# Patient Record
Sex: Male | Born: 1995 | Hispanic: Yes | Smoking: Never smoker
Health system: Southern US, Community
[De-identification: ages and names within clinical notes are randomized; demographics above are authoritative.]

## PROBLEM LIST (undated history)

## (undated) HISTORY — PX: EYE SURGERY: SHX253

## (undated) HISTORY — PX: TESTICLE SURGERY: SHX794

---

## 2018-08-19 ENCOUNTER — Encounter (HOSPITAL_COMMUNITY): Payer: Self-pay

## 2018-08-19 ENCOUNTER — Other Ambulatory Visit: Payer: Self-pay

## 2018-08-19 ENCOUNTER — Emergency Department (HOSPITAL_COMMUNITY)
Admission: EM | Admit: 2018-08-19 | Discharge: 2018-08-19 | Disposition: A | Discharge status: Home / Self Care | Payer: Self-pay | Attending: Emergency Medicine | Admitting: Emergency Medicine

## 2018-08-19 DIAGNOSIS — R21 Rash and other nonspecific skin eruption: Secondary | ICD-10-CM | POA: Insufficient documentation

## 2018-08-19 MED ORDER — DOXYCYCLINE HYCLATE 100 MG PO CAPS: 100.0000 mg | ORAL_CAPSULE | Freq: Two times a day (BID) | ORAL | 0 refills | Status: AC

## 2018-08-19 NOTE — ED Provider Notes (Signed)
[facility]-EMERGENCY DEPT Provider Note   CSN: [id number] Arrival date & time: 08/19/18  1215    History   Chief Complaint Chief Complaint  Patient presents with  . Rash    HPI Gae DryYohanly Yortez is a 23 y.o. male.     23 year old male presents with rash to his groin which is now spread to his genitals x2 to 3 weeks.  Characterizes it as pruritic without associated penile drainage or discharge.  No involvement of the palms of his hands or soles of his feet.  States he is sexually active.  Has medicated with topical steroids with minimal relief.  No reported fever or chills.  No prior history of same.  Denies any use of new chemicals     History reviewed. No pertinent past medical history.  There are no active problems to display for this patient.   Past Surgical History:  Procedure Laterality Date  . TESTICLE SURGERY          Home Medications    Prior to Admission medications   Not on File    Family History History reviewed. No pertinent family history.  Social History Social History   Tobacco Use  . Smoking status: Never Smoker  . Smokeless tobacco: Never Used  Substance Use Topics  . Alcohol use: Yes    Comment: occ  . Drug use: Not Currently    Types: Marijuana     Allergies   Patient has no known allergies.   Review of Systems Review of Systems  All other systems reviewed and are negative.    Physical Exam Updated Vital Signs BP (!) 154/97 (BP Location: Right Arm)   Pulse 88   Temp 98.4 F (36.9 C) (Oral)   Resp 14   Ht 1.702 m (5\' 7" )   Wt 79.8 kg   SpO2 98%   BMI 27.57 kg/m   Physical Exam Vitals signs and nursing note reviewed.  Constitutional:      General: He is not in acute distress.    Appearance: Normal appearance. He is well-developed. He is not toxic-appearing.  HENT:     Head: Normocephalic and atraumatic.  Eyes:     General: Lids are normal.     Conjunctiva/sclera: Conjunctivae normal.   Pupils: Pupils are equal, round, and reactive to light.  Neck:     Musculoskeletal: Normal range of motion and neck supple.     Thyroid: No thyroid mass.     Trachea: No tracheal deviation.  Cardiovascular:     Rate and Rhythm: Normal rate and regular rhythm.     Heart sounds: Normal heart sounds. No murmur. No gallop.   Pulmonary:     Effort: Pulmonary effort is normal. No respiratory distress.     Breath sounds: Normal breath sounds. No stridor. No decreased breath sounds, wheezing, rhonchi or rales.  Abdominal:     General: Bowel sounds are normal. There is no distension.     Palpations: Abdomen is soft.     Tenderness: There is no abdominal tenderness. There is no rebound.  Genitourinary:    Pubic Area: Rash present.     Penis: Uncircumcised. No erythema or discharge.   Musculoskeletal: Normal range of motion.        General: No tenderness.  Skin:    General: Skin is warm and dry.     Findings: No abrasion or rash.       Neurological:     Mental Status: He is alert and oriented  to person, place, and time.     GCS: GCS eye subscore is 4. GCS verbal subscore is 5. GCS motor subscore is 6.     Cranial Nerves: No cranial nerve deficit.     Sensory: No sensory deficit.  Psychiatric:        Speech: Speech normal.        Behavior: Behavior normal.      ED Treatments / Results  Labs (all labs ordered are listed, but only abnormal results are displayed) Labs Reviewed - No data to display  EKG None  Radiology No results found.  Procedures Procedures (including critical care time)  Medications Ordered in ED Medications - No data to display   Initial Impression / Assessment and Plan / ED Course  I have reviewed the triage vital signs and the nursing notes.  Pertinent labs & imaging results that were available during my care of the patient were reviewed by me and considered in my medical decision making (see chart for details).        Patient with probable  folliculitis.  Possibility of syphilis as well given his rash but he has no palmar involvement.  He has no involvement on his torso.  Denied any lesions before this.  Will place patient on doxycycline and have instructed him to follow-up with a dermatologist of his choice.  Final Clinical Impressions(s) / ED Diagnoses   Final diagnoses:  None    ED Discharge Orders    None       Lorre Nick, MD 08/19/18 1256

## 2018-08-19 NOTE — Discharge Instructions (Signed)
Follow-up with a dermatologist of your choice °

## 2018-08-19 NOTE — ED Triage Notes (Signed)
Pt arrives POV from home. Pt reports one month ago he notices a rash on his inner thigh that looked like pimples, pt reports rash subsided but came back for the last 2-3 weeks. Pt reports rash is sporadic and random but now has it on his penis and testicles. Pt reports itching to the rash

## 2018-10-11 ENCOUNTER — Other Ambulatory Visit: Payer: Self-pay

## 2018-10-11 ENCOUNTER — Emergency Department (HOSPITAL_COMMUNITY)
Admission: EM | Admit: 2018-10-11 | Discharge: 2018-10-11 | Disposition: A | Discharge status: Home / Self Care | Payer: Self-pay | Attending: Emergency Medicine | Admitting: Emergency Medicine

## 2018-10-11 ENCOUNTER — Encounter (HOSPITAL_COMMUNITY): Payer: Self-pay | Admitting: Emergency Medicine

## 2018-10-11 ENCOUNTER — Emergency Department (HOSPITAL_COMMUNITY): Payer: Self-pay

## 2018-10-11 DIAGNOSIS — R55 Syncope and collapse: Secondary | ICD-10-CM | POA: Insufficient documentation

## 2018-10-11 DIAGNOSIS — Z79899 Other long term (current) drug therapy: Secondary | ICD-10-CM | POA: Insufficient documentation

## 2018-10-11 DIAGNOSIS — R112 Nausea with vomiting, unspecified: Secondary | ICD-10-CM | POA: Insufficient documentation

## 2018-10-11 LAB — BASIC METABOLIC PANEL
Anion gap: 9 (ref 5–15)
BUN: 13 mg/dL (ref 6–20)
CO2: 25 mmol/L (ref 22–32)
Calcium: 9.1 mg/dL (ref 8.9–10.3)
Chloride: 103 mmol/L (ref 98–111)
Creatinine, Ser: 1.03 mg/dL (ref 0.61–1.24)
GFR calc Af Amer: >60 mL/min (ref >60)
GFR calc non Af Amer: >60 mL/min (ref >60)
Glucose, Bld: 85 mg/dL (ref 70–99)
Potassium: 3.5 mmol/L (ref 3.5–5.1)
Sodium: 137 mmol/L (ref 135–145)

## 2018-10-11 LAB — CBC
HCT: 44.4 % (ref 39.0–52.0)
Hemoglobin: 15.1 g/dL (ref 13.0–17.0)
MCH: 28.8 pg (ref 26.0–34.0)
MCHC: 34 g/dL (ref 30.0–36.0)
MCV: 84.6 fL (ref 80.0–100.0)
Platelets: 395 10*3/uL (ref 150–400)
RBC: 5.25 MIL/uL (ref 4.22–5.81)
RDW: 12.7 % (ref 11.5–15.5)
WBC: 9.6 10*3/uL (ref 4.0–10.5)
nRBC: 0 % (ref 0.0–0.2)

## 2018-10-11 LAB — HEPATIC FUNCTION PANEL
ALT: 25 U/L (ref 0–44)
AST: 21 U/L (ref 15–41)
Albumin: 4 g/dL (ref 3.5–5.0)
Alkaline Phosphatase: 44 U/L (ref 38–126)
Bilirubin, Direct: 0.1 mg/dL (ref 0.0–0.2)
Indirect Bilirubin: 1 mg/dL — ABNORMAL HIGH (ref 0.3–0.9)
Total Bilirubin: 1.1 mg/dL (ref 0.3–1.2)
Total Protein: 6.9 g/dL (ref 6.5–8.1)

## 2018-10-11 LAB — CBG MONITORING, ED: Glucose-Capillary: 77 mg/dL (ref 70–99)

## 2018-10-11 LAB — LIPASE, BLOOD: Lipase: 29 U/L (ref 11–51)

## 2018-10-11 MED ORDER — DIPHENHYDRAMINE HCL 50 MG/ML IJ SOLN
25.0000 mg | Freq: Once | INTRAMUSCULAR | Status: AC
  Administered 2018-10-11: 25 mg via INTRAVENOUS
  Filled 2018-10-11: qty 1

## 2018-10-11 MED ORDER — SODIUM CHLORIDE 0.9% FLUSH: 3.0000 mL | Freq: Once | INTRAVENOUS | Status: DC

## 2018-10-11 MED ORDER — SODIUM CHLORIDE 0.9 % IV BOLUS
1000.0000 mL | Freq: Once | INTRAVENOUS | Status: AC
  Administered 2018-10-11: 1000 mL via INTRAVENOUS

## 2018-10-11 MED ORDER — PROCHLORPERAZINE EDISYLATE 10 MG/2ML IJ SOLN
10.0000 mg | Freq: Once | INTRAMUSCULAR | Status: AC
  Administered 2018-10-11: 10 mg via INTRAVENOUS
  Filled 2018-10-11: qty 2

## 2018-10-11 MED ORDER — ONDANSETRON 4 MG PO TBDP: 4.0000 mg | ORAL_TABLET | Freq: Three times a day (TID) | ORAL | 0 refills | Status: AC | PRN

## 2018-10-11 NOTE — ED Provider Notes (Signed)
[facility]   CSN: [id number] Arrival date & time: 10/11/18  2133    History   Chief Complaint No chief complaint on file.   HPI Baruch GoutyYohanly Coleman is a 23 y.o. male.     23 yo M with a chief complaints of a syncopal episode.  Patient has felt unwell today.  He has been vomiting at work every day this week.  Has some mild abdominal pain after vomiting.  The patient was at work started feeling bad had a few episodes of vomiting and then felt like he would pass out.  Eventually walk to his supervisor's office sat down and then passed out.  He denies fevers.  He did strike his head when he passed out apparently.  Complaining of a headache and had had some vomiting afterwards.  The history is provided by the patient.  Illness Severity:  Mild Onset quality:  Gradual Duration:  2 days Timing:  Constant Progression:  Worsening Chronicity:  New Associated symptoms: nausea and vomiting   Associated symptoms: no abdominal pain, no chest pain, no congestion, no diarrhea, no fever, no headaches, no myalgias, no rash and no shortness of breath     History reviewed. No pertinent past medical history.  There are no active problems to display for this patient.   Past Surgical History:  Procedure Laterality Date  . TESTICLE SURGERY          Home Medications    Prior to Admission medications   Medication Sig Start Date End Date Taking? Authorizing Provider  doxycycline (VIBRAMYCIN) 100 MG capsule Take 1 capsule (100 mg total) by mouth 2 (two) times daily. 08/19/18   Lorre NickAllen, Anthony, MD  ondansetron (ZOFRAN ODT) 4 MG disintegrating tablet Take 1 tablet (4 mg total) by mouth every 8 (eight) hours as needed for nausea or vomiting. 10/11/18   Melene PlanFloyd, Jenee Spaugh, DO    Family History History reviewed. No pertinent family history.  Social History Social History   Tobacco Use  . Smoking status: Never Smoker  . Smokeless tobacco: Never Used   Substance Use Topics  . Alcohol use: Not Currently    Comment: occ  . Drug use: Not Currently    Types: Marijuana     Allergies   Patient has no known allergies.   Review of Systems Review of Systems  Constitutional: Negative for chills and fever.  HENT: Negative for congestion and facial swelling.   Eyes: Negative for discharge and visual disturbance.  Respiratory: Negative for shortness of breath.   Cardiovascular: Negative for chest pain and palpitations.  Gastrointestinal: Positive for nausea and vomiting. Negative for abdominal pain and diarrhea.  Musculoskeletal: Negative for arthralgias and myalgias.  Skin: Negative for color change and rash.  Neurological: Positive for syncope. Negative for tremors and headaches.  Psychiatric/Behavioral: Negative for confusion and dysphoric mood.     Physical Exam Updated Vital Signs BP 128/74   Pulse 64   Temp 98.8 F (37.1 C) (Oral)   Resp 17   Ht 5\' 7"  (1.702 m)   Wt 80.7 kg   SpO2 99%   BMI 27.88 kg/m   Physical Exam Vitals signs and nursing note reviewed.  Constitutional:      Appearance: He is well-developed.  HENT:     Head: Normocephalic and atraumatic.  Eyes:     Pupils: Pupils are equal, round, and reactive to light.  Neck:     Musculoskeletal: Normal range of motion and neck supple.  Vascular: No JVD.  Cardiovascular:     Rate and Rhythm: Normal rate and regular rhythm.     Heart sounds: No murmur. No friction rub. No gallop.   Pulmonary:     Effort: No respiratory distress.     Breath sounds: No wheezing.  Abdominal:     General: There is no distension.     Tenderness: There is no guarding or rebound.     Comments: Benign abdominal exam  Musculoskeletal: Normal range of motion.  Skin:    Coloration: Skin is not pale.     Findings: No rash.  Neurological:     Mental Status: He is alert and oriented to person, place, and time.  Psychiatric:        Behavior: Behavior normal.      ED  Treatments / Results  Labs (all labs ordered are listed, but only abnormal results are displayed) Labs Reviewed  HEPATIC FUNCTION PANEL - Abnormal; Notable for the following components:      Result Value   Indirect Bilirubin 1.0 (*)    All other components within normal limits  BASIC METABOLIC PANEL  CBC  LIPASE, BLOOD  URINALYSIS, ROUTINE W REFLEX MICROSCOPIC  CBG MONITORING, ED  CBG MONITORING, ED    EKG EKG Interpretation  Date/Time:  Friday October 11 2018 21:44:00 EDT Ventricular Rate:  56 PR Interval:    QRS Duration: 99 QT Interval:  391 QTC Calculation: 378 R Axis:   74 Text Interpretation:  Sinus rhythm ST elev, probable normal early repol pattern No significant change since last tracing Confirmed by Melene PlanFloyd, Vonne Mcdanel [phone]([id number]) on 10/11/2018 10:48:34 PM   Radiology Ct Head Wo Contrast  Result Date: 10/11/2018 CLINICAL DATA:  Initial evaluation for acute syncope, minor head trauma. EXAM: CT HEAD WITHOUT CONTRAST TECHNIQUE: Contiguous axial images were obtained from the base of the skull through the vertex without intravenous contrast. COMPARISON:  None. FINDINGS: Brain: Cerebral volume within normal limits for patient age. No evidence for acute intracranial hemorrhage. No findings to suggest acute large vessel territory infarct. No mass lesion, midline shift, or mass effect. Ventricles are normal in size without evidence for hydrocephalus. No extra-axial fluid collection identified. Vascular: No hyperdense vessel identified. Skull: Scalp soft tissues demonstrate no acute abnormality. Calvarium intact. Sinuses/Orbits: Globes and orbital soft tissues within normal limits. Visualized paranasal sinuses are clear. Small chronic appearing bilateral mastoid effusions, of doubtful significance. IMPRESSION: Negative head CT.  No acute intracranial abnormality identified. Electronically Signed   By: Rise MuBenjamin  McClintock M.D.   On: 10/11/2018 23:02    Procedures Procedures (including critical  care time)  Medications Ordered in ED Medications  sodium chloride flush (NS) 0.9 % injection 3 mL (has no administration in time range)  sodium chloride 0.9 % bolus 1,000 mL (1,000 mLs Intravenous New Bag/Given 10/11/18 2233)  prochlorperazine (COMPAZINE) injection 10 mg (10 mg Intravenous Given 10/11/18 2234)  diphenhydrAMINE (BENADRYL) injection 25 mg (25 mg Intravenous Given 10/11/18 2235)     Initial Impression / Assessment and Plan / ED Course  I have reviewed the triage vital signs and the nursing notes.  Pertinent labs & imaging results that were available during my care of the patient were reviewed by me and considered in my medical decision making (see chart for details).        23 yo M with a chief complaint of a syncopal event.  This happened right after vomiting.  Sounds vasovagal by history.  Patient has had recurrent abdominal pain and vomiting.  LFTs and lipase are unremarkable.  No significant electrolyte abnormality.  Patient is able to tolerate by mouth here.  He did strike his head and was having vomiting afterwards so he had a CT done.  Negative is viewed by me.  PCP follow-up.  11:18 PM:  I have discussed the diagnosis/risks/treatment options with the patient and believe the pt to be eligible for discharge home to follow-up with PCP. We also discussed returning to the ED immediately if new or worsening sx occur. We discussed the sx which are most concerning (e.g., sudden worsening pain, fever, inability to tolerate by mouth ) that necessitate immediate return. Medications administered to the patient during their visit and any new prescriptions provided to the patient are listed below.  Medications given during this visit Medications  sodium chloride flush (NS) 0.9 % injection 3 mL (has no administration in time range)  sodium chloride 0.9 % bolus 1,000 mL (1,000 mLs Intravenous New Bag/Given 10/11/18 2233)  prochlorperazine (COMPAZINE) injection 10 mg (10 mg Intravenous  Given 10/11/18 2234)  diphenhydrAMINE (BENADRYL) injection 25 mg (25 mg Intravenous Given 10/11/18 2235)     The patient appears reasonably screen and/or stabilized for discharge and I doubt any other medical condition or other [facility] requiring further screening, evaluation, or treatment in the ED at this time prior to discharge.    Final Clinical Impressions(s) / ED Diagnoses   Final diagnoses:  Syncope and collapse  Nausea and vomiting in adult    ED Discharge Orders         Ordered    ondansetron (ZOFRAN ODT) 4 MG disintegrating tablet  Every 8 hours PRN     10/11/18 2314           Deno Etienne, DO 10/11/18 2318

## 2018-10-11 NOTE — ED Notes (Signed)
Patient transported to CT 

## 2018-10-11 NOTE — ED Triage Notes (Signed)
Pt BIB [facility] following syncopal episode at work . Pt states similar episode 2 years ago with syncopal episode d/t dehydration. Pt. States working frequently, no getting enough rest for past 2-3 weeks. States taking sleeping pills  Pt. Reports abdominal pain 7 out of 10 below umbilical starting prior to syncopal epsiode. Pt states NV, with emesis x1.   With headache rating 9 out of 10 describe as throbbing starting after syncopal episode. Pt. States hitting head.  A&O x4  Per EMS 500 mL bolus  EMS VS CBG 75 BP 116/78 PR 66 SpO2 97 RA

## 2018-10-11 NOTE — ED Notes (Signed)
Discharge instructions discussed with pt. Pt. verbalized understanding. Pt to go home with wife. No questions at this time.

## 2019-05-12 ENCOUNTER — Emergency Department (HOSPITAL_COMMUNITY): Payer: Self-pay

## 2019-05-12 ENCOUNTER — Other Ambulatory Visit: Payer: Self-pay

## 2019-05-12 ENCOUNTER — Encounter (HOSPITAL_COMMUNITY): Payer: Self-pay

## 2019-05-12 ENCOUNTER — Emergency Department (HOSPITAL_COMMUNITY)
Admission: EM | Admit: 2019-05-12 | Discharge: 2019-05-12 | Disposition: A | Discharge status: Home / Self Care | Payer: Self-pay | Attending: Emergency Medicine | Admitting: Emergency Medicine

## 2019-05-12 DIAGNOSIS — R0602 Shortness of breath: Secondary | ICD-10-CM | POA: Insufficient documentation

## 2019-05-12 DIAGNOSIS — W108XXA Fall (on) (from) other stairs and steps, initial encounter: Secondary | ICD-10-CM | POA: Insufficient documentation

## 2019-05-12 DIAGNOSIS — Y9289 Other specified places as the place of occurrence of the external cause: Secondary | ICD-10-CM | POA: Insufficient documentation

## 2019-05-12 DIAGNOSIS — Y9389 Activity, other specified: Secondary | ICD-10-CM | POA: Insufficient documentation

## 2019-05-12 DIAGNOSIS — S0990XA Unspecified injury of head, initial encounter: Secondary | ICD-10-CM | POA: Insufficient documentation

## 2019-05-12 DIAGNOSIS — R519 Headache, unspecified: Secondary | ICD-10-CM | POA: Insufficient documentation

## 2019-05-12 DIAGNOSIS — Y999 Unspecified external cause status: Secondary | ICD-10-CM | POA: Insufficient documentation

## 2019-05-12 NOTE — ED Triage Notes (Signed)
Patient states he missed a step on some stairs and fell backwards hitting his head. Patient denies any blurred vision. Patient also c/o slight nausea.  Patient states he was having headaches 3 days ago prior to hitting head today.  Patient states he had lasix eye surgery 3 months ago.

## 2019-05-12 NOTE — ED Notes (Signed)
Patient was verbalized discharge instructions. PT had no further questions at this time. NAD. 

## 2019-05-12 NOTE — Discharge Instructions (Signed)

## 2019-05-12 NOTE — ED Provider Notes (Signed)
Emergency Department Provider Note   I have reviewed the triage vital signs and the nursing notes.   HISTORY  Chief Complaint Fall and Head Injury   HPI Jimmy Coleman is a 24 y.o. male presents to the emergency department for evaluation of fall with head injury.  Fall occurred today.  He states he has had 3 days of intermittent left-sided headache prior to falling.  Today he reports "missing a step" and falling backwards down approximately 6 steps.  He did not lose consciousness.  After the fall he has had some lightheadedness without unilateral weakness/numbness.  He does not have any blurred vision.  No vomiting but has felt slightly nauseated.  He notes that in the last several days he has felt intermittently short of breath but not experienced heart palpitations or chest pain.  Symptoms are not reproducible or predictable. Denies sudden onset/maximal intensity headache symptoms prior to falling. Denies neck or back pain. No pain in the extremities.   History reviewed. No pertinent past medical history.  There are no problems to display for this patient.   Past Surgical History:  Procedure Laterality Date  . EYE SURGERY     bilateral lasix surgery  . TESTICLE SURGERY      Allergies Patient has no known allergies.  Family History  Problem Relation Age of Onset  . Healthy Mother   . Healthy Father     Social History Social History   Tobacco Use  . Smoking status: Never Smoker  . Smokeless tobacco: Never Used  Substance Use Topics  . Alcohol use: Yes    Comment: occ  . Drug use: Not Currently    Types: Marijuana    Review of Systems  Constitutional: No fever/chills Eyes: No visual changes. ENT: No sore throat. Cardiovascular: Denies chest pain. Denies palpitations.  Respiratory: Intermittent mild SOB. No cough.  Gastrointestinal: No abdominal pain. No nausea, no vomiting.  No diarrhea.  No constipation. Genitourinary: Negative for  dysuria. Musculoskeletal: Negative for back pain. Skin: Negative for rash. Neurological: Negative for focal weakness or numbness. Positive intermittent left sided HA.   10-point ROS otherwise negative.  ____________________________________________   PHYSICAL EXAM:  VITAL SIGNS: ED Triage Vitals  Enc Vitals Group     BP 05/12/19 1747 135/84     Pulse Rate 05/12/19 1747 68     Resp 05/12/19 1747 18     Temp 05/12/19 1747 99.9 F (37.7 C)     Temp Source 05/12/19 1747 Oral     SpO2 05/12/19 1747 99 %     Weight 05/12/19 1748 185 lb 1.6 oz (84 kg)     Height 05/12/19 1748 5\' 7"  (1.702 m)   Constitutional: Alert and oriented. Well appearing and in no acute distress. Eyes: Conjunctivae are normal.  Head: Atraumatic. Nose: No congestion/rhinnorhea. Ears: Normal external canals.  No hemotympanum or middle ear effusion.  Mouth/Throat: Mucous membranes are moist.   Neck: No stridor. No cervical spine tenderness.  Cardiovascular: Normal rate, regular rhythm. Good peripheral circulation. Grossly normal heart sounds.   Respiratory: Normal respiratory effort.  No retractions. Lungs CTAB. Gastrointestinal: Soft and nontender. No distention.  Musculoskeletal: No lower extremity tenderness nor edema. No gross deformities of extremities. Neurologic:  Normal speech and language. No gross focal neurologic deficits are appreciated.  Skin:  Skin is warm, dry and intact. No rash noted.  ____________________________________________  EKG   EKG Interpretation  Date/Time:  Monday May 12 2019 20:26:59 EST Ventricular Rate:  67 PR Interval:  QRS Duration: 98 QT Interval:  389 QTC Calculation: 411 R Axis:   84 Text Interpretation: Sinus arrhythmia Nonspecific ST changes. No STEMI Confirmed by Alona Bene [phone]) on 05/12/2019 8:44:56 PM       ____________________________________________  RADIOLOGY  CT Head Wo Contrast  Result Date: 05/12/2019 CLINICAL DATA:  Head trauma,  headache, fell backwards with head strike EXAM: CT HEAD WITHOUT CONTRAST TECHNIQUE: Contiguous axial images were obtained from the base of the skull through the vertex without intravenous contrast. COMPARISON:  CT head 10/11/2018 FINDINGS: Brain: No evidence of acute infarction, hemorrhage, hydrocephalus, extra-axial collection or mass lesion/mass effect. Vascular: No hyperdense vessel or unexpected calcification. Skull: Areas of stable stranding in the subcutaneous tissues of the scalp likely reflecting scarring seen in the right parietal scalp inferiorly and left retroauricular soft tissues. No large scalp hematoma. No calvarial fracture. No suspicious osseous lesions. Sinuses/Orbits: Paranasal sinuses and mastoid air cells are predominantly clear. Included orbital structures are unremarkable. Other: None IMPRESSION: No acute intracranial abnormality. No acute scalp swelling or calvarial fracture. Electronically Signed   By: Kreg Shropshire M.D.   On: 05/12/2019 22:39   DG Chest Portable 1 View  Result Date: 05/12/2019 CLINICAL DATA:  Fall EXAM: PORTABLE CHEST 1 VIEW COMPARISON:  None. FINDINGS: The heart size and mediastinal contours are within normal limits. Both lungs are clear. The visualized skeletal structures are unremarkable. IMPRESSION: No active disease. Electronically Signed   By: Charlett Nose M.D.   On: 05/12/2019 20:24    ____________________________________________   PROCEDURES  Procedure(s) performed:   Procedures  None ____________________________________________   INITIAL IMPRESSION / ASSESSMENT AND PLAN / ED COURSE  Pertinent labs & imaging results that were available during my care of the patient were reviewed by me and considered in my medical decision making (see chart for details).   Patient presents to the emergency department after mechanical fall today with head injury.  He did fall down approximately 6 steps making mechanism somewhat dangerous for clinically  significant head injury.  In the setting I do not feel the patient can be cleared by [facility] CT rule.  No outward sign of trauma or evidence of basilar skull fracture clinically.  Patient is having intermittent headache and some shortness of breath prior to the mechanical fall.  EKG is reassuring and chest x-ray with no acute findings.  Doubt COVID-19 clinically. CT head pending.   Imaging and EKG reviewed. No acute findings. Discussed concussion mgmt and need for Neurology f/u if symptoms persist. Discussed mgmt of symptoms at home and ED return precautions.  ____________________________________________  FINAL CLINICAL IMPRESSION(S) / ED DIAGNOSES  Final diagnoses:  Injury of head, initial encounter  SOB (shortness of breath)    Note:  This document was prepared using Dragon voice recognition software and may include unintentional dictation errors.  Alona Bene, MD, [facility] Emergency Medicine    Kagen Kunath, Arlyss Repress, MD 05/13/19 1004

## 2020-09-21 IMAGING — CT CT HEAD W/O CM
3 series · 15 of 47 positions shown, 18 images · non-contrast
Comparison: CT head 10/11/2018

CLINICAL DATA: Head trauma, headache, fell backwards with head
strike

EXAM:
CT HEAD WITHOUT CONTRAST
TECHNIQUE: Contiguous axial images were obtained from the base of the skull
through the vertex without intravenous contrast.

[Series 2: head wo · axial · 0.46mm/px · z∈[-135,+5]mm · 9 of 34 slices shown, 12 images]
[im 3/34  brain]
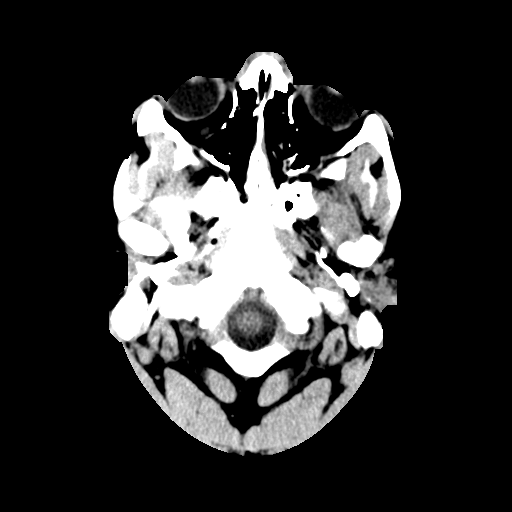
[im 3/34  bone]
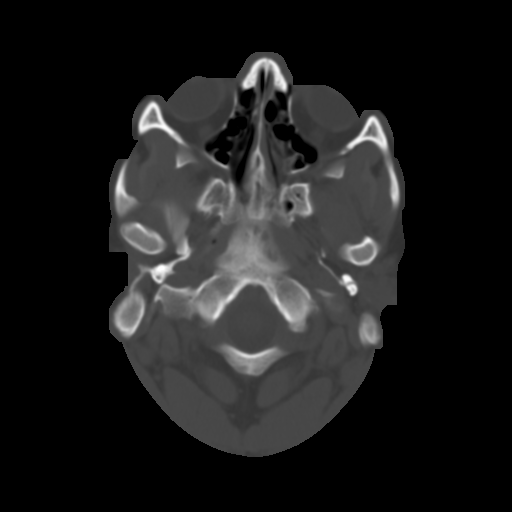
[im 6/34  brain]
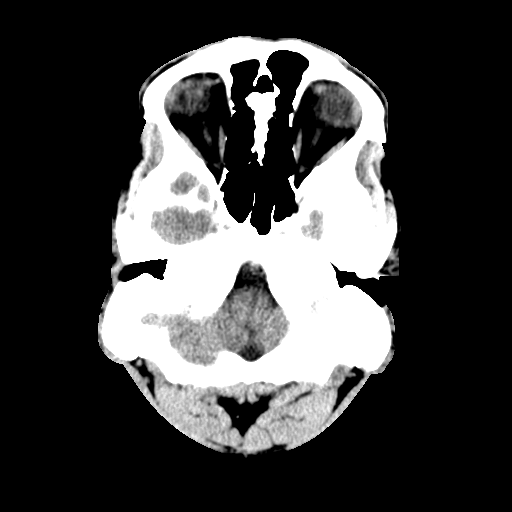
[im 10/34  brain]
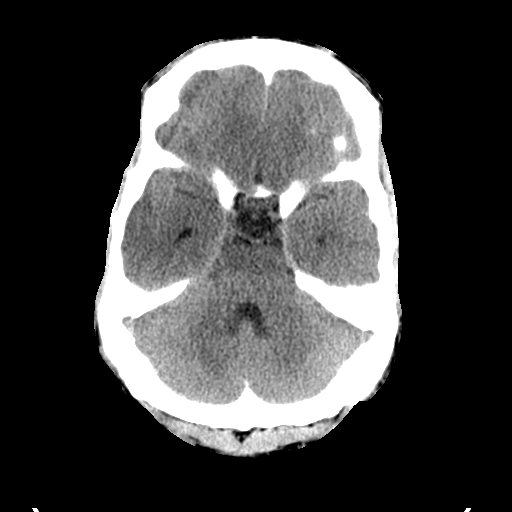
[im 13/34  brain]
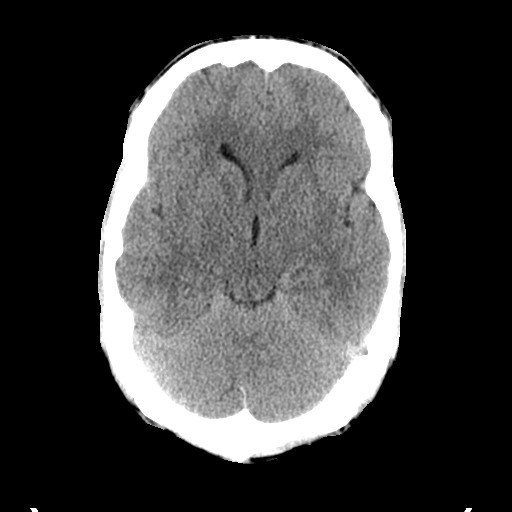
[im 18/34  brain]
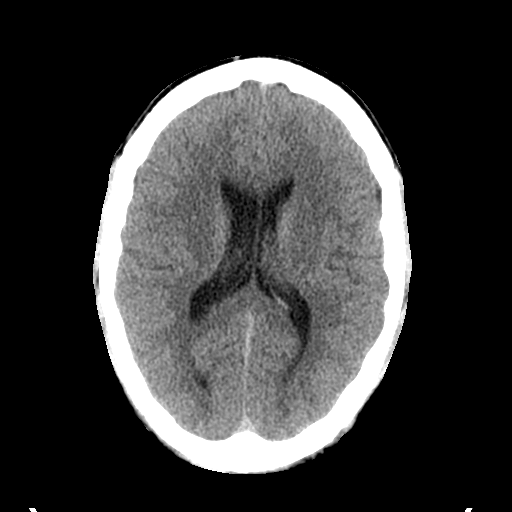
[im 18/34  bone]
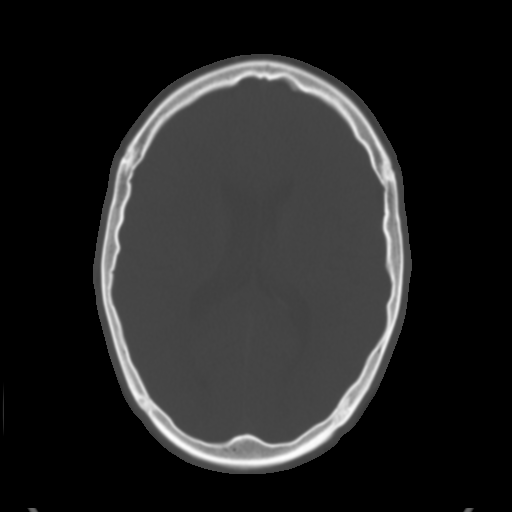
[im 21/34  brain]
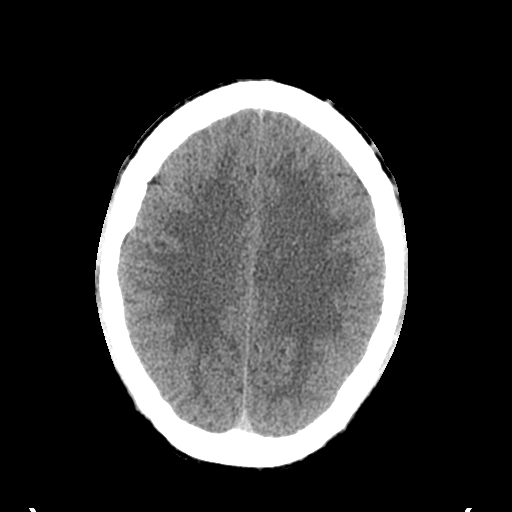
[im 24/34  brain]
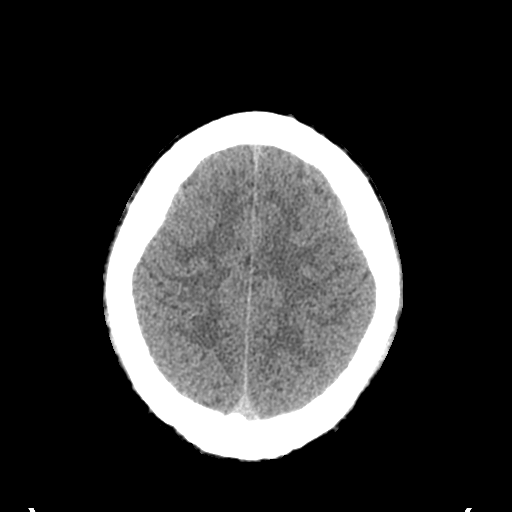
[im 28/34  brain]
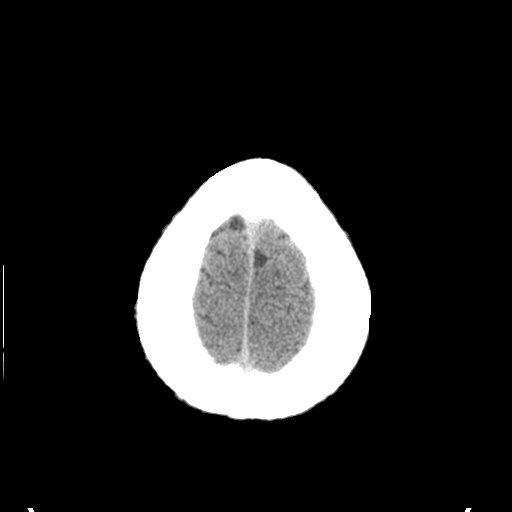
[im 31/34  brain]
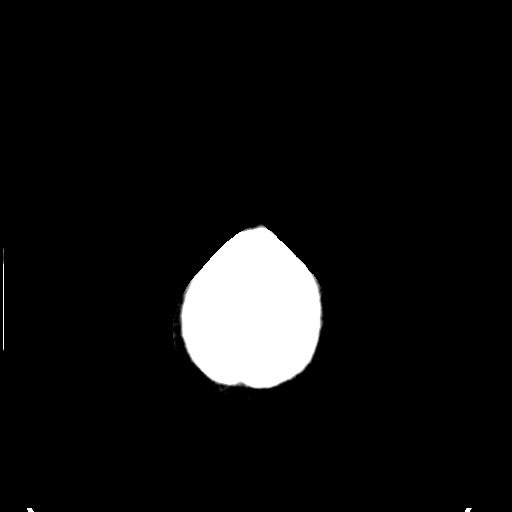
[im 31/34  bone]
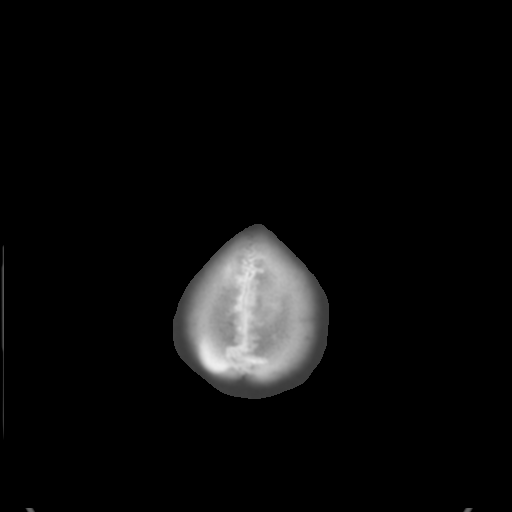

[Series 5: coronal soft tissue · coronal · 0.33mm/px · 3 of 76 slices shown]
[im 26/76  brain]
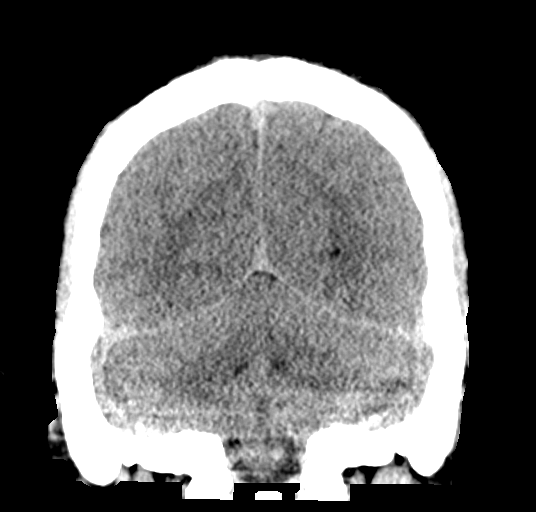
[im 34/76  brain]
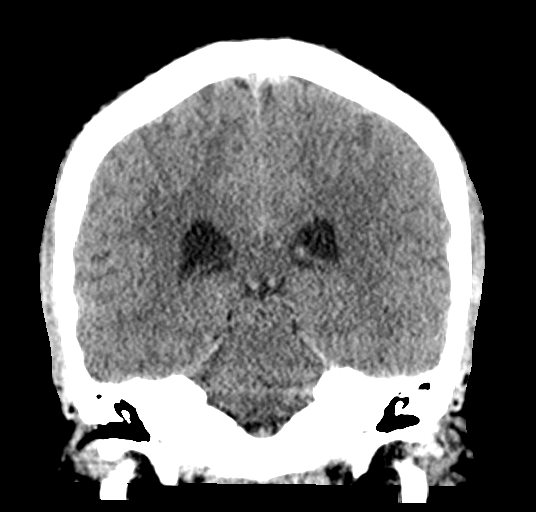
[im 42/76  brain]
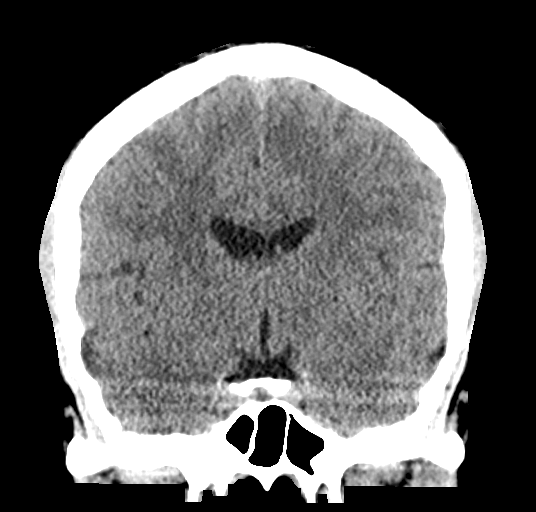

[Series 6: sagittal soft tissue · sagittal · 0.35mm/px · 3 of 63 slices shown]
[im 21/63  brain]
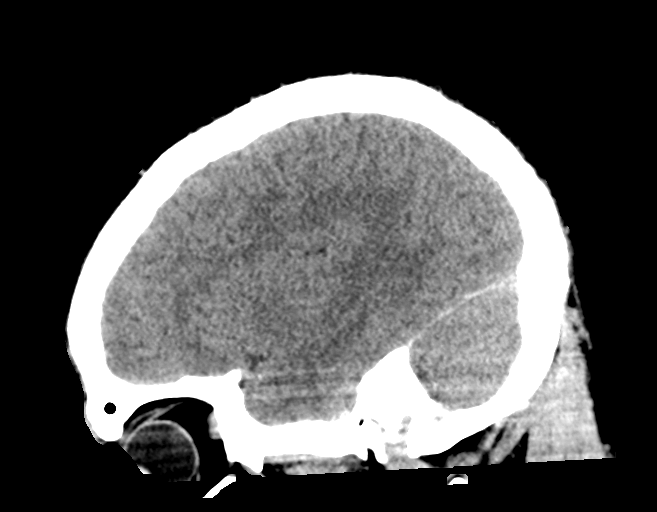
[im 32/63  brain]
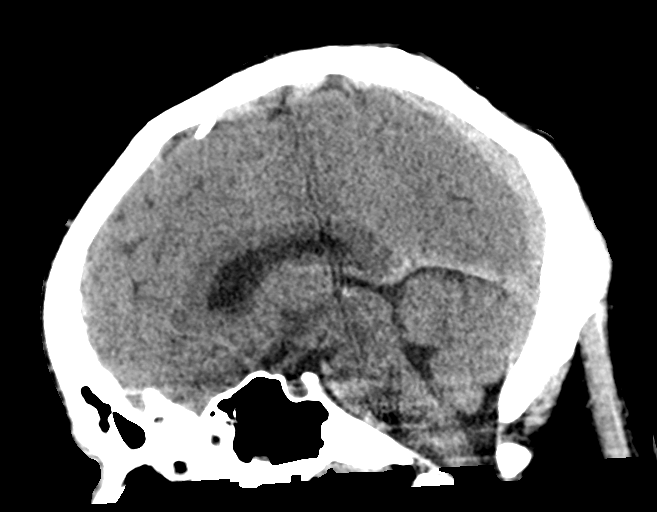
[im 42/63  brain]
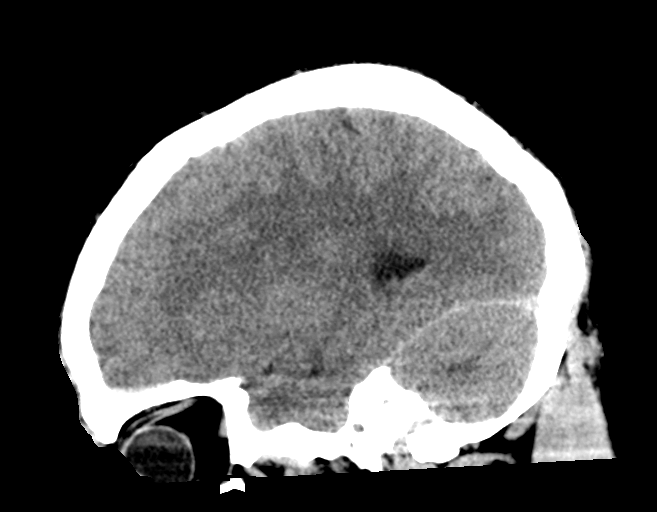

[15 of 47 positions shown; findings below may reference images not displayed]

FINDINGS: Brain: No evidence of acute infarction, hemorrhage, hydrocephalus,
extra-axial collection or mass lesion/mass effect.

Vascular: No hyperdense vessel or unexpected calcification.

Skull: Areas of stable stranding in the subcutaneous tissues of the
scalp likely reflecting scarring seen in the right parietal scalp
inferiorly and left retroauricular soft tissues. No large scalp
hematoma. No calvarial fracture. No suspicious osseous lesions.

Sinuses/Orbits: Paranasal sinuses and mastoid air cells are
predominantly clear. Included orbital structures are unremarkable.

Other: None
IMPRESSION: No acute intracranial abnormality. No acute scalp swelling or
calvarial fracture.
